# Patient Record
Sex: Female | Born: 1975 | Race: White | Hispanic: No | Marital: Married | Smoking: Never smoker
Health system: Southern US, Community
[De-identification: ages and names within clinical notes are randomized; demographics above are authoritative.]

---

## 2007-07-19 ENCOUNTER — Ambulatory Visit (HOSPITAL_COMMUNITY): Admission: RE | Admit: 2007-07-19 | Discharge: 2007-07-19 | Payer: Self-pay | Admitting: Family Medicine

## 2008-12-28 ENCOUNTER — Encounter (INDEPENDENT_AMBULATORY_CARE_PROVIDER_SITE_OTHER): Payer: Self-pay | Admitting: Obstetrics

## 2008-12-28 ENCOUNTER — Inpatient Hospital Stay (HOSPITAL_COMMUNITY): Admission: RE | Admit: 2008-12-28 | Discharge: 2008-12-31 | Payer: Self-pay | Admitting: Obstetrics

## 2010-10-21 LAB — URINALYSIS, ROUTINE W REFLEX MICROSCOPIC
Nitrite: NEGATIVE
Specific Gravity, Urine: <1.005 — ABNORMAL LOW (ref 1.005–1.030)
Urobilinogen, UA: 0.2 mg/dL (ref 0.0–1.0)
pH: 6 (ref 5.0–8.0)

## 2010-10-21 LAB — CBC
HCT: 35.8 % — ABNORMAL LOW (ref 36.0–46.0)
MCHC: 34.5 g/dL (ref 30.0–36.0)
MCV: 94.2 fL (ref 78.0–100.0)
RBC: 2.82 MIL/uL — ABNORMAL LOW (ref 3.87–5.11)
RBC: 3.8 MIL/uL — ABNORMAL LOW (ref 3.87–5.11)
WBC: 7.9 10*3/uL (ref 4.0–10.5)
WBC: 9.7 10*3/uL (ref 4.0–10.5)

## 2010-10-21 LAB — RPR: RPR Ser Ql: NONREACTIVE

## 2010-10-21 LAB — ABO/RH: ABO/RH(D): A POS

## 2010-11-26 NOTE — Op Note (Signed)
NAMELANYIAH, BRIX NO.:  [id number]   MEDICAL RECORD NO.:  [id number]          PATIENT TYPE:  INP   LOCATION:  9127                          FACILITY:  [facility]   PHYSICIAN:  Lendon Colonel, MD   DATE OF BIRTH:  1976-02-27   DATE OF PROCEDURE:  12/28/2008  DATE OF DISCHARGE:                               OPERATIVE REPORT   PREOPERATIVE DIAGNOSES:  Prior cesarean section, 5 weeks' gestation,  desire for elective repeat, undesired fertility.   POSTOPERATIVE DIAGNOSES:  Prior cesarean section, 39 weeks' gestation,  desire for elective repeat, undesired fertility.   PROCEDURE:  Repeat cesarean section, bilateral tubal ligation.   SURGEON:  Lendon Colonel, MD   ASSISTANT:  Awilda Bill.   ANESTHESIA:  Spinal.   FINDINGS:  Female infant in the LOT position, Apgars 9 and 9, weight 8  pounds 4 ounces.  Normal uterus, tubes, and ovaries.  Clear amniotic  fluid.   SPECIMENS:  Placenta to Labor and Delivery, bilateral tubal segments to  Pathology.   ANTIBIOTICS:  900 mg IV clindamycin.   ESTIMATED BLOOD LOSS:  900 mL.   COMPLICATIONS:  None.   INDICATIONS:  This is a 35 year old G4, P2-0-1-2 at 17 weeks' gestation  with desire for elective repeat and tubal ligation, no pregnancy  complications.   DESCRIPTION OF PROCEDURE:  After informed consent was obtained, the  patient was taken to the operating room where spinal anesthesia was  initiated.  She was prepped and draped in the normal sterile fashion in  the dorsal lithotomy position with a leftward tilt.  A Pfannenstiel skin  incision was made 2 cm above the pubic symphysis in the midline with the  scalpel.  This was done over the old Pfannenstiel scar, was carried  through to the underlying layer of fascia with Bovie cautery.  Fascia  was incised in midline and the incision was extended laterally with Mayo  scissors.  The inferior aspect of fascial incision was grasped with  Kocher clamps, elevated  up, and the underlying rectus muscle dissected  off sharply.  Superior aspect of the fascial incision was grasped with  Kocher clamps, elevated up, and the underlying rectus muscles were  dissected off sharply.  The rectus muscles were separated.  There was  some scarring between the rectus muscles and off the peritoneum.  Kelly  clamps were placed on the peritoneal edges.  These were separated and  entry into the peritoneum was sharply with the scalpel ensuring no  bladder, bowel, or omentum was adhered.  Once entering the peritoneum,  finger dissection was done to ensure no adhesive disease.  The  peritoneal incision was extended superiorly and inferiorly with good  visualization of the bladder.  The underlying contents and the bladder  blade was inserted.  Vesicouterine peritoneum was identified, grasped  with pickups, and entered sharply.  The incision was then laterally and  a bladder flap was created digitally.  Palpation of the uterus was done,  adequate room was noted.  A  low transverse incision was made with the  scalpel, extended bluntly.  Infant's occiput was grasped,  rotated,  brought to the incision, and head was delivered.  Nuchal cord x1 was  reduced.  Remainder of the infant was delivered without complication.  Nose and mouth were bulb suctioned.  Cord was clamped and cut and handed  off to awaiting pediatrician.  Placenta was expressed.  Uterus was  exteriorized, cleared of all clots and debris.  Uterine incision was  repaired with 0  Vicryl in a running locked fashion.  A second layer of  same suture was used in imbricating fashion to obtain good hemostasis.  There was a small hematoma at the left angle of the incision.  This was  found to be stable throughout the case.  A small degree of atony was  noted in the first several minutes until the uterus was closed.  Good  uterine tone was then noted.  The right fallopian tube was grasped with  a Babcock.  The mid isthmic  portion had a 2-cm knuckle of tube tied off  with free tie of plain gut x2.  A window was created in the mesosalpinx  and the tube was excised.  Similar procedure was carried out on the  left.  Excellent hemostasis was noted at tubal  segment.  The uterine  incision was reinspected, found to be hemostatic.  There was no gross to  the hematoma.  Uterus was returned to the abdomen.  The pelvis was  cleared of all clots and debris.  The pelvis was irrigated with warm  normal saline.  The tubal segments were reinspected once inside the  abdomen, found to be hemostatic.  The uterine incision was found to be  hemostatic.  Tagged edges were cut.  The perineum was closed with 2-0  Vicryl in a running fashion and the fascia was closed with a 0 Vicryl in  a running fashion in a single layer.  The skin was closed with a 4-0  Monocryl in subcuticular fashion.  The patient tolerated the procedure  well.  Sponge, lap, and needle counts were correct x3.  The patient was  taken to recovery room in a stable condition.      Lendon Colonel, MD  Electronically Signed     KAF/MEDQ  D:  12/28/2008  T:  12/28/2008  Job:  [id number]

## 2010-11-29 NOTE — Discharge Summary (Signed)
Cynthia Santos, CADOTTE NO.:  [id number]   MEDICAL RECORD NO.:  [id number]          PATIENT TYPE:  INP   LOCATION:  9127                          FACILITY:  [facility]   PHYSICIAN:  Lendon Colonel, MD   DATE OF BIRTH:  1975-10-22   DATE OF ADMISSION:  12/28/2008  DATE OF DISCHARGE:  12/31/2008                               DISCHARGE SUMMARY   CHIEF COMPLAINT:  Here for repeat C-section.   HISTORY OF PRESENT ILLNESS:  This is a 35 year old, G4, P2-0-1-2 with  prior C-section x2 was here for elective repeat C-section at 39 weeks.  Her prenatal issues are significant for planned tubal ligation, left  echogenic intracardiac focus being Rh negative, 2 prior C-sections, and  a history of LEEP.   ALLERGIES:  AMOXICILLIN.   PAST MEDICAL HISTORY:  As per her admission H&P and her OB flow sheet.   PAST SURGICAL HISTORY:  As per her admission H&P and her OB flow sheet   OBSTETRICAL HISTORY:  As per her admission H&P and her OB flow sheet.   GYNECOLOGIC HISTORY:  As per her admission H&P and her OB flow sheet.   PHYSICAL EXAMINATION:  GENERAL:  The patient was afebrile.  VITAL SIGNS:  Stable and was well appearing on admission.   PLAN:  Take her to OR for repeat C-section and tubal ligation.   HOSPITAL COURSE:  Repeat C-section and tubal ligation was done without  complications, that operative note be found in a separate dictation,  given her AMOXICILLIN allergy.  The patient did receive 900 mg of IV  clindamycin and did produced a female infant in the left OT position  with Apgars of 9 and 9 and 8 pounds 4 ounces.  Postoperatively, the  patient did well.  By postop day #3, she was ambulating, voiding,  tolerating regular p.o.  Her pain was controlled with p.o. medicines and  the decision was made to discharge her to home.  She was given Percocet  and Motrin.  I recommended to take some Colace.   DISCHARGE DISPOSITION:  To home.   DISCHARGE CONDITION:  Stable.   DISCHARGE DIAGNOSES:  1. Status post repeat cesarean section.  2. Bilateral tubal ligation.      Lendon Colonel, MD  Electronically Signed     KAF/MEDQ  D:  01/11/2009  T:  01/12/2009  Job:  [id number]

## 2011-03-18 ENCOUNTER — Ambulatory Visit: Payer: Self-pay | Admitting: Family Medicine

## 2011-03-18 ENCOUNTER — Telehealth: Payer: Self-pay | Admitting: *Deleted
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
## 2011-03-18 NOTE — Telephone Encounter (Signed)
May reschedule

## 2011-03-18 NOTE — Telephone Encounter (Signed)
Appt 11:00 for new pt appt today.  I spoke with pt, she thought it was for tomorrow.  Pt states she moved this weekend and days are all mixed up. I explained we do have a no show policy and that I would check with Dr Caryl Never re: her no show.  Pt apologized for missed appt.

## 2016-06-24 ENCOUNTER — Ambulatory Visit (INDEPENDENT_AMBULATORY_CARE_PROVIDER_SITE_OTHER): Payer: BC Managed Care – PPO | Admitting: Sports Medicine

## 2016-06-24 ENCOUNTER — Encounter: Payer: Self-pay | Admitting: Sports Medicine

## 2016-06-24 ENCOUNTER — Ambulatory Visit
Admission: RE | Admit: 2016-06-24 | Discharge: 2016-06-24 | Disposition: A | Discharge status: Home / Self Care | Payer: BC Managed Care – PPO | Source: Ambulatory Visit | Attending: Sports Medicine | Admitting: Sports Medicine

## 2016-06-24 VITALS — BP 108/69 | Ht 65.0 in | Wt 126.0 lb

## 2016-06-24 DIAGNOSIS — G4489 Other headache syndrome: Secondary | ICD-10-CM

## 2016-06-24 DIAGNOSIS — S060X0A Concussion without loss of consciousness, initial encounter: Secondary | ICD-10-CM
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
## 2020-05-03 ENCOUNTER — Ambulatory Visit: Payer: BC Managed Care – PPO | Admitting: Sports Medicine

## 2020-05-08 ENCOUNTER — Ambulatory Visit (INDEPENDENT_AMBULATORY_CARE_PROVIDER_SITE_OTHER): Payer: BC Managed Care – PPO | Admitting: Sports Medicine

## 2020-05-08 ENCOUNTER — Other Ambulatory Visit: Payer: Self-pay

## 2020-05-08 VITALS — BP 102/72 | Ht 65.0 in | Wt 125.0 lb

## 2020-05-08 DIAGNOSIS — M25562 Pain in left knee: Secondary | ICD-10-CM

## 2020-05-09 NOTE — Progress Notes (Signed)
   Subjective:    Patient ID: Cynthia Santos, female    DOB: 07/14/1976, 44 y.o.   MRN: [id number]  HPI chief complaint: Left knee pain  44 year old female comes in today complaining of several weeks of posterior left knee pain.  She does not recall any specific injury but rather describes a gradual onset of pain that is only located in the popliteal fossa.  It is most noticeable when running.  Specifically, her pain becomes quite severe after about a mile.  She has stopped running as a result of this.  She was seen by an orthopedist a few weeks ago and an ultrasound of her left knee was ordered to rule out possible Baker's cyst.  X-rays at that visit showed no significant degenerative changes.  She denies pain elsewhere in the knee.  She has not noticed swelling but does note that she gets a feeling of fullness in the popliteal fossa with her discomfort.  She has tried both ibuprofen and Aleve with some improvement.  She has also tried icing with minimal improvement.  She had 5 visits with physical therapy without any relief.  No prior knee surgeries.  No significant knee trauma in the past.  Pain resolves at rest.  Past medical history reviewed Medications reviewed Allergies reviewed    Review of Systems    As above Objective:   Physical Exam  Well-developed, fit appearing.  No acute distress.  Left knee: Full range of motion.  No obvious effusion.  No significant patellofemoral crepitus.  Slight tenderness to palpation in the popliteal fossa.  No appreciable cyst.  Knee is stable to valgus and varus stressing.  Negative anterior drawer, negative posterior drawer.  No tenderness to palpation along medial or lateral joint lines.  Negative Thessaly's.  Neurovascular intact distally.  Limited [facility] ultrasound of the left knee was performed.  Suprapatellar pouch shows no evidence of effusion.  Visualized medial and lateral menisci are unremarkable.  No spurring appreciated.  Inspection of the  popliteal fossa shows no obvious Baker's cyst and no muscle tear.  Impression is normal left knee ultrasound.  Left knee x-rays as above      Assessment & Plan:   Chronic posterior left knee pain-question proximal gastrocnemius strain  I see no evidence of Baker's cyst on today's ultrasound.  I question whether or not she has chronic proximal gastrocnemius strain.  Going to have her start daily [employer]son heel drop exercises and I have given her 5/16 inch heel lifts to wear in her running shoes.  I think she is okay to continue with activity using pain as her guide.  I have also recommended a compression sleeve with activity.  She did not bring her running shoes with her today but I think it would be worthwhile scheduling a follow-up sometime in the near future to do a running evaluation.  If symptoms persist or worsen consider merits of further diagnostic imaging.  Follow-up as needed.

## 2022-02-27 ENCOUNTER — Ambulatory Visit: Payer: BC Managed Care – PPO | Admitting: Family Medicine

## 2022-03-27 ENCOUNTER — Ambulatory Visit: Payer: BC Managed Care – PPO | Admitting: Sports Medicine

## 2022-03-27 VITALS — BP 126/76 | Ht 65.0 in | Wt 128.0 lb

## 2022-03-27 DIAGNOSIS — M7582 Other shoulder lesions, left shoulder: Secondary | ICD-10-CM | POA: Diagnosis not present

## 2022-03-27 MED ORDER — METHYLPREDNISOLONE ACETATE 40 MG/ML IJ SUSP
40.0000 mg | Freq: Once | INTRAMUSCULAR | Status: AC
  Administered 2022-03-27: 40 mg via INTRA_ARTICULAR

## 2022-03-27 NOTE — Progress Notes (Signed)
   Subjective:    Patient ID: ELIVIA ROBOTHAM, female    DOB: 11/07/1975, 46 y.o.   MRN: [id number]  HPI chief complaint: Left shoulder pain  Cheyan presents today complaining of 2 days of left shoulder pain.  She is left-hand dominant.  She does not recall any specific injury but does state that she was having some intermittent discomfort a few weeks leading up to her acute onset of pain 2 days ago.  Prior to her pain she reports that she was cleaning her daughter's showers and fairly aggressively grabbing the ground.  It was shortly after that she began to experience diffuse shoulder pain and decreased range of motion.  She had difficulty sleeping a couple of nights ago due to the pain.  She has been taking 400 mg of over-the-counter ibuprofen and alternating that with Tylenol.  That has been helping.  Today her pain and range of motion have improved some but are certainly not back to normal.  Pain will radiate at times down the humerus but not past the elbow.  No numbness or tingling.  Past medical history reviewed Medications reviewed Allergies reviewed    Review of Systems As above    Objective:   Physical Exam  Well-developed, well-nourished.  No acute distress  Left shoulder: Full active range of motion.  Positive painful arc.  No tenderness to palpation over the acromioclavicular joint nor over the bicipital groove.  Negative empty can, negative Hawkins.  Rotator cuff strength is 5/5 but does reproduce pain with resisted supraspinatus.  Neurovascularly intact distally.      Assessment & Plan:   Left shoulder pain secondary to rotator cuff strain versus rotator cuff tendinitis  I discussed treatment options including continuing with oral anti-inflammatories versus subacromial cortisone injection.  Annabelle would like to proceed with a subacromial cortisone injection.  That is accomplished atraumatically under sterile technique.  She did note improvement in her pain with the  anesthetic in place.  She may continue with her over-the-counter ibuprofen as needed but if symptoms persist we will need to get some imaging starting with x-ray and [facility] ultrasound.  Follow-up for ongoing or recalcitrant issues.  Consent obtained and verified. Time-out conducted. Noted no overlying erythema, induration, or other signs of local infection. Skin prepped in a sterile fashion. Topical analgesic spray: Ethyl chloride. Joint: Left shoulder, subacromial Needle: 25-gauge 1.5 inch Completed without difficulty. Meds: 3 cc 1% Xylocaine, 1 cc (40 mg) Depo-Medrol  Advised to call if fevers/chills, erythema, induration, drainage, or persistent bleeding.   This note was dictated using Dragon naturally speaking software and may contain errors in syntax, spelling, or content which have not been identified prior to signing this note.

## 2022-09-30 ENCOUNTER — Ambulatory Visit: Payer: BC Managed Care – PPO | Admitting: Sports Medicine

## 2022-09-30 VITALS — BP 108/74 | Ht 65.0 in | Wt 130.0 lb

## 2022-09-30 DIAGNOSIS — M7582 Other shoulder lesions, left shoulder: Secondary | ICD-10-CM

## 2022-09-30 MED ORDER — METHYLPREDNISOLONE ACETATE 40 MG/ML IJ SUSP
40.0000 mg | Freq: Once | INTRAMUSCULAR | Status: AC
  Administered 2022-09-30: 40 mg via INTRA_ARTICULAR

## 2022-09-30 NOTE — Progress Notes (Signed)
   Subjective:    Patient ID: Cynthia Santos, female    DOB: June 27, 1976, 47 y.o.   MRN: [id number]  HPI chief complaint: Left shoulder pain  Cynthia Santos presents today with returning left shoulder pain.  She was last seen in the office in September of last year with a similar complaint.  Subacromial cortisone injection was effective at that time.  Her pain has now returned without any inciting event.  Most of her pain is in the proximal humerus.  She notices it especially at night.  She denies any new exercises at the gym.  She is left-hand dominant.    Review of Systems As above    Objective:   Physical Exam  Well-developed, fit appearing.  No acute distress  Left shoulder: Full range of motion.  No tenderness to palpation.  Positive empty can.  Rotator cuff strength is 5/5 but does reproduce pain with resisted supraspinatus and external rotation.  Neurovascularly intact distally.      Assessment & Plan:   Returning left shoulder pain secondary to rotator cuff impingement  Subacromial cortisone injection administered as below.  She tolerates this without difficulty.  She will start a [employer] home exercise program once her symptoms improve.  She understands that she needs to avoid repetitive overhead exercises at the gym.  Follow-up for ongoing or recalcitrant issues.  Consent obtained and verified. Time-out conducted. Noted no overlying erythema, induration, or other signs of local infection. Skin prepped in a sterile fashion. Topical analgesic spray: Ethyl chloride. Joint: Left shoulder, subacromial Needle: 25-gauge 1.5 inch Completed without difficulty. Meds: 3 cc 1% Xylocaine, 1 cc (40 mg) Depo-Medrol  This note was dictated using Dragon naturally speaking software and may contain errors in syntax, spelling, or content which have not been identified prior to signing this note.

## 2022-09-30 NOTE — Addendum Note (Signed)
Addended by: Jolinda Croak E on: 09/30/2022 12:06 PM   Modules accepted: Orders

## 2023-05-27 ENCOUNTER — Encounter: Payer: Self-pay | Admitting: Sports Medicine

## 2023-05-27 ENCOUNTER — Ambulatory Visit: Payer: BC Managed Care – PPO | Admitting: Sports Medicine

## 2023-05-27 VITALS — BP 110/78 | Ht 65.0 in | Wt 133.0 lb

## 2023-05-27 DIAGNOSIS — M7582 Other shoulder lesions, left shoulder: Secondary | ICD-10-CM | POA: Diagnosis not present

## 2023-05-27 MED ORDER — METHYLPREDNISOLONE ACETATE 40 MG/ML IJ SUSP
40.0000 mg | Freq: Once | INTRAMUSCULAR | Status: AC
  Administered 2023-05-27: 40 mg via INTRA_ARTICULAR

## 2023-05-27 NOTE — Progress Notes (Signed)
   Subjective:    Patient ID: Cynthia Santos, female    DOB: Apr 11, 1976, 47 y.o.   MRN: [id number]  HPI chief complaint: Left shoulder pain  Cynthia Santos presents today requesting a repeat left shoulder injection.  She has a history of rotator cuff tendinitis that has responded favorably to injections in the past.  Pain is identical to what she has experienced previously.  Pain is intermittent and present with certain motions such as arm abduction or with pulling heavy objects.  No nighttime pain.  No trauma.  She is left-hand dominant.    Review of Systems As above    Objective:   Physical Exam  Well-developed, well-nourished.  No acute distress  Left shoulder: Good range of motion.  No tenderness to palpation.  Positive signs of impingement.  Good rotator cuff strength.      Assessment & Plan:   Left shoulder pain secondary to rotator cuff tendinitis  Left shoulder subacromial space is injected with cortisone today.  She tolerates this without difficulty.  She has a set of home exercises for her rotator cuff that she has been doing intermittently but not consistently.  She she will resume those exercises daily.  We also discussed formal physical therapy but we will wait on that for now.  Follow-up as needed.  Consent obtained and verified. Time-out conducted. Noted no overlying erythema, induration, or other signs of local infection. Skin prepped in a sterile fashion. Topical analgesic spray: Ethyl chloride. Joint: Left shoulder (subacromial) Needle: 25-gauge 1.5 inch Completed without difficulty. Meds: Cc 1% Xylocaine, 1 cc (40 mg) Depo-Medrol  This note was dictated using Dragon naturally speaking software and may contain errors in syntax, spelling, or content which have not been identified prior to signing this note.
